# Patient Record
Sex: Male | Born: 1995 | Race: Black or African American | Hispanic: No | Marital: Single
Health system: Southern US, Community
[De-identification: ages and names within clinical notes are randomized; demographics above are authoritative.]

## PROBLEM LIST (undated history)

## (undated) DIAGNOSIS — I10 Essential (primary) hypertension: Secondary | ICD-10-CM

---
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
## 2013-01-05 ENCOUNTER — Encounter (HOSPITAL_COMMUNITY): Payer: Self-pay | Admitting: Emergency Medicine

## 2013-01-05 ENCOUNTER — Emergency Department (HOSPITAL_COMMUNITY)
Admission: EM | Admit: 2013-01-05 | Discharge: 2013-01-05 | Discharge status: Against Medical Advice | Payer: Medicaid Other | Source: Home / Self Care

## 2013-01-05 NOTE — ED Notes (Signed)
Victor Cranker, PA came into room and did not find pt.  Pt had notified me earlier that he was stepping outside to see a fam member and would be back soon Will wait 10 minutes till pt is d/c w/o being seen .

## 2013-01-05 NOTE — ED Notes (Signed)
Pt c/o pain/stiff neck on right side since Wed am Reports waking up to the pain and believes it may have been b/c he slept in a bad position Pain is constant and hurts to turn left/right Has not had any meds for discomfort Denies: inj/trauma to site, f/v/n/d  He is alert and oriented w/no signs of acute distress.

## 2014-06-01 ENCOUNTER — Emergency Department (HOSPITAL_COMMUNITY): Payer: No Typology Code available for payment source

## 2014-06-01 ENCOUNTER — Encounter (HOSPITAL_COMMUNITY): Payer: Self-pay | Admitting: Emergency Medicine

## 2014-06-01 ENCOUNTER — Emergency Department (HOSPITAL_COMMUNITY)
Admission: EM | Admit: 2014-06-01 | Discharge: 2014-06-01 | Disposition: A | Discharge status: Home / Self Care | Payer: No Typology Code available for payment source | Attending: Emergency Medicine | Admitting: Emergency Medicine

## 2014-06-01 DIAGNOSIS — T148XXA Other injury of unspecified body region, initial encounter: Secondary | ICD-10-CM

## 2014-06-01 DIAGNOSIS — Y92410 Unspecified street and highway as the place of occurrence of the external cause: Secondary | ICD-10-CM | POA: Insufficient documentation

## 2014-06-01 DIAGNOSIS — Y9389 Activity, other specified: Secondary | ICD-10-CM | POA: Insufficient documentation

## 2014-06-01 DIAGNOSIS — IMO0002 Reserved for concepts with insufficient information to code with codable children: Secondary | ICD-10-CM | POA: Diagnosis not present

## 2014-06-01 MED ORDER — NAPROXEN 500 MG PO TABS: 500.0000 mg | ORAL_TABLET | Freq: Two times a day (BID) | ORAL | Status: AC
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
## 2014-06-01 NOTE — ED Provider Notes (Signed)
Medical screening examination/treatment/procedure(s) were performed by non-physician practitioner and as supervising physician I was immediately available for consultation/collaboration.   EKG Interpretation None        Jadis Mika N Oswell Say, DO 06/01/14 2135 

## 2014-06-01 NOTE — ED Notes (Signed)
PA at bedside.

## 2014-06-01 NOTE — Discharge Instructions (Signed)

## 2014-06-02 NOTE — ED Provider Notes (Signed)
Medical screening examination/treatment/procedure(s) were performed by non-physician practitioner and as supervising physician I was immediately available for consultation/collaboration.   EKG Interpretation None        Kerrigan Gombos N Neila Teem, DO 06/02/14 0043 

## 2014-06-05 ENCOUNTER — Encounter (HOSPITAL_COMMUNITY): Payer: Self-pay | Admitting: Emergency Medicine

## 2014-06-05 ENCOUNTER — Emergency Department (INDEPENDENT_AMBULATORY_CARE_PROVIDER_SITE_OTHER)
Admission: EM | Admit: 2014-06-05 | Discharge: 2014-06-05 | Disposition: A | Discharge status: Home / Self Care | Payer: Medicaid Other | Source: Home / Self Care | Attending: Family Medicine | Admitting: Family Medicine

## 2014-06-05 DIAGNOSIS — M545 Low back pain, unspecified: Secondary | ICD-10-CM

## 2014-06-05 MED ORDER — CYCLOBENZAPRINE HCL 10 MG PO TABS: 10.0000 mg | ORAL_TABLET | Freq: Every evening | ORAL | Status: DC | PRN

## 2014-06-05 NOTE — Discharge Instructions (Signed)
Thank you for coming in today. Physical therapy should contact you soon.  Use a heating pad Continue Aleve Use Flexeril at bedtime for pain as needed  Come back or go to the emergency room if you notice new weakness new numbness problems walking or bowel or bladder problems.   Back Exercises These exercises may help you when beginning to rehabilitate your injury. Your symptoms may resolve with or without further involvement from your physician, physical therapist or athletic trainer. While completing these exercises, remember:   Restoring tissue flexibility helps normal motion to return to the joints. This allows healthier, less painful movement and activity.  An effective stretch should be held for at least 30 seconds.  A stretch should never be painful. You should only feel a gentle lengthening or release in the stretched tissue. STRETCH - Extension, Prone on Elbows   Lie on your stomach on the floor, a bed will be too soft. Place your palms about shoulder width apart and at the height of your head.  Place your elbows under your shoulders. If this is too painful, stack pillows under your chest.  Allow your body to relax so that your hips drop lower and make contact more completely with the floor.  Hold this position for __________ seconds.  Slowly return to lying flat on the floor. Repeat __________ times. Complete this exercise __________ times per day.  RANGE OF MOTION - Extension, Prone Press Ups   Lie on your stomach on the floor, a bed will be too soft. Place your palms about shoulder width apart and at the height of your head.  Keeping your back as relaxed as possible, slowly straighten your elbows while keeping your hips on the floor. You may adjust the placement of your hands to maximize your comfort. As you gain motion, your hands will come more underneath your shoulders.  Hold this position __________ seconds.  Slowly return to lying flat on the floor. Repeat  __________ times. Complete this exercise __________ times per day.  RANGE OF MOTION- Quadruped, Neutral Spine   Assume a hands and knees position on a firm surface. Keep your hands under your shoulders and your knees under your hips. You may place padding under your knees for comfort.  Drop your head and point your tail bone toward the ground below you. This will round out your low back like an angry cat. Hold this position for __________ seconds.  Slowly lift your head and release your tail bone so that your back sags into a large arch, like an old horse.  Hold this position for __________ seconds.  Repeat this until you feel limber in your low back.  Now, find your "sweet spot." This will be the most comfortable position somewhere between the two previous positions. This is your neutral spine. Once you have found this position, tense your stomach muscles to support your low back.  Hold this position for __________ seconds. Repeat __________ times. Complete this exercise __________ times per day.  STRETCH - Flexion, Single Knee to Chest   Lie on a firm bed or floor with both legs extended in front of you.  Keeping one leg in contact with the floor, bring your opposite knee to your chest. Hold your leg in place by either grabbing behind your thigh or at your knee.  Pull until you feel a gentle stretch in your low back. Hold __________ seconds.  Slowly release your grasp and repeat the exercise with the opposite side. Repeat __________ times.  Complete this exercise __________ times per day.  STRETCH - Hamstrings, Standing  Stand or sit and extend your right / left leg, placing your foot on a chair or foot stool  Keeping a slight arch in your low back and your hips straight forward.  Lead with your chest and lean forward at the waist until you feel a gentle stretch in the back of your right / left knee or thigh. (When done correctly, this exercise requires leaning only a small  distance.)  Hold this position for __________ seconds. Repeat __________ times. Complete this stretch __________ times per day. STRENGTHENING - Deep Abdominals, Pelvic Tilt   Lie on a firm bed or floor. Keeping your legs in front of you, bend your knees so they are both pointed toward the ceiling and your feet are flat on the floor.  Tense your lower abdominal muscles to press your low back into the floor. This motion will rotate your pelvis so that your tail bone is scooping upwards rather than pointing at your feet or into the floor.  With a gentle tension and even breathing, hold this position for __________ seconds. Repeat __________ times. Complete this exercise __________ times per day.  STRENGTHENING - Abdominals, Crunches   Lie on a firm bed or floor. Keeping your legs in front of you, bend your knees so they are both pointed toward the ceiling and your feet are flat on the floor. Cross your arms over your chest.  Slightly tip your chin down without bending your neck.  Tense your abdominals and slowly lift your trunk high enough to just clear your shoulder blades. Lifting higher can put excessive stress on the low back and does not further strengthen your abdominal muscles.  Control your return to the starting position. Repeat __________ times. Complete this exercise __________ times per day.  STRENGTHENING - Quadruped, Opposite UE/LE Lift   Assume a hands and knees position on a firm surface. Keep your hands under your shoulders and your knees under your hips. You may place padding under your knees for comfort.  Find your neutral spine and gently tense your abdominal muscles so that you can maintain this position. Your shoulders and hips should form a rectangle that is parallel with the floor and is not twisted.  Keeping your trunk steady, lift your right hand no higher than your shoulder and then your left leg no higher than your hip. Make sure you are not holding your breath.  Hold this position __________ seconds.  Continuing to keep your abdominal muscles tense and your back steady, slowly return to your starting position. Repeat with the opposite arm and leg. Repeat __________ times. Complete this exercise __________ times per day. Document Released: 09/16/2005 Document Revised: 11/21/2011 Document Reviewed: 12/11/2008 [facility] Patient Information 2015 [place], [place]. This information is not intended to replace advice given to you by your health care provider. Make sure you discuss any questions you have with your health care provider.
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
## 2014-06-05 NOTE — ED Notes (Signed)
Pt triaged and assessed by provider.   Provider in room before nurse.  

## 2014-07-08 ENCOUNTER — Ambulatory Visit: Payer: Medicaid Other | Attending: Family Medicine

## 2014-07-08 DIAGNOSIS — M545 Low back pain: Secondary | ICD-10-CM | POA: Insufficient documentation

## 2014-07-08 DIAGNOSIS — I10 Essential (primary) hypertension: Secondary | ICD-10-CM | POA: Diagnosis not present

## 2014-07-08 DIAGNOSIS — Z5189 Encounter for other specified aftercare: Secondary | ICD-10-CM | POA: Diagnosis present

## 2014-07-09 ENCOUNTER — Ambulatory Visit: Payer: Medicaid Other

## 2014-07-09 DIAGNOSIS — Z5189 Encounter for other specified aftercare: Secondary | ICD-10-CM | POA: Diagnosis not present

## 2014-07-18 ENCOUNTER — Ambulatory Visit: Payer: Medicaid Other | Attending: Family Medicine | Admitting: Physical Therapy

## 2014-07-18 DIAGNOSIS — I10 Essential (primary) hypertension: Secondary | ICD-10-CM | POA: Insufficient documentation

## 2014-07-18 DIAGNOSIS — Z5189 Encounter for other specified aftercare: Secondary | ICD-10-CM | POA: Insufficient documentation

## 2014-07-18 DIAGNOSIS — M545 Low back pain: Secondary | ICD-10-CM | POA: Insufficient documentation

## 2014-07-21 ENCOUNTER — Ambulatory Visit: Payer: Medicaid Other

## 2014-07-21 DIAGNOSIS — M545 Low back pain, unspecified: Secondary | ICD-10-CM

## 2014-07-21 DIAGNOSIS — I10 Essential (primary) hypertension: Secondary | ICD-10-CM | POA: Diagnosis not present

## 2014-07-21 DIAGNOSIS — Z5189 Encounter for other specified aftercare: Secondary | ICD-10-CM | POA: Diagnosis present

## 2014-07-21 NOTE — Therapy (Signed)
Physical Therapy Treatment  Patient Details  Name: Victor Reyes MRN: [id number] Date of Birth: 1996-03-06  Encounter Date: 07/21/2014      PT End of Session - 07/21/14 1351    Visit Number 3   Number of Visits 10   Date for PT Re-Evaluation 08/12/14   PT Start Time 1330   PT Stop Time 1415   PT Time Calculation (min) 45 min   Activity Tolerance Patient tolerated treatment well      No past medical history on file.  No past surgical history on file.  There were no vitals taken for this visit.  Visit Diagnosis:  Right-sided low back pain without sciatica          [facility] Adult PT Treatment/Exercise - 07/21/14 1342    Exercises   Exercises Lumbar   Lumbar Exercises: Stretches   Active Hamstring Stretch 3 reps;30 seconds;Other (comment)   Active Hamstring Stretch Limitations bil, seated    Standing Extension 2 reps;Other (comment)   Standing Extension Limitations 10 reps    Press Ups 3 reps;Other (comment)   Press Ups Limitations 10 reps   Lumbar Exercises: Prone   Straight Leg Raise 10 reps;Other (comment)   Straight Leg Raises Limitations prone hip ext, 2 sets each    Opposite Arm/Leg Raise Right arm/Left leg;Left arm/Right leg;10 reps;2 seconds   Modalities   Modalities Moist Heat   Moist Heat Therapy   Number Minutes Moist Heat 13 Minutes   Moist Heat Location Other (comment)  lumbar spine, pt prone.    Manual Therapy   Manual Therapy Myofascial release   Manual Therapy   Myofascial Release STM and MFR to L/S with increased tenderness at R paraspinals.           Education - 07/21/14 1351    Education provided Yes   Education Details HEP   Education Details Patient   Methods Explanation;Demonstration   Comprehension Verbalized understanding;Returned demonstration;Verbal cues required;Need further instruction          PT Short Term Goals - 07/21/14 1409    PT SHORT TERM GOAL #1   Title All STGs= LTGs   Time 2   Period Weeks   Status On-going    PT SHORT TERM GOAL #2   Title be indep with initial HEP    Time 2   Period Weeks   Status On-going   PT SHORT TERM GOAL #3   Title report decrease by 2 grades    Time 2   Period Weeks   Status On-going   PT SHORT TERM GOAL #4   Title demonstrated and verbalize proper posture and positions to avoid   Time 2   Period Weeks   Status On-going          PT Long Term Goals - 07/21/14 1410    PT LONG TERM GOAL #1   Title Demonstrate and verbalize techniques to reduce the risk of re-injury to include info on: body mechanics, posture.   Time 5   Period Weeks   Status On-going   PT LONG TERM GOAL #2   Title be indep with advanced HEP   Time 5   Period Weeks   Status On-going   PT LONG TERM GOAL #3   Title increase ROM lumbar flexion to 50 degrees without pain to improve bending and stooping   Time 5   Period Weeks   Status On-going   PT LONG TERM GOAL #4   Title demonstrate proper body mechanics  for heavy lifting pain-free > 20#   Time 5   Period Weeks   Status On-going   PT LONG TERM GOAL #5   Title be pain-free with all ADLs and work activites, like reaching overhead   Time 5   Period Weeks   Status Not Met   Additional Long Term Goals   Additional Long Term Goals Yes   PT LONG TERM GOAL #6   Title Bil Hamstring length improve to -28 degrees from straight in 90/90 position supine to improve flexibility   Time 5   Period Weeks   Status On-going          Plan - 07/21/14 1407    Clinical Impression Statement Good progress initially, but pt reprots increased pain at work. 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 Pt also admits to HEP. Reviewed HEP and gave written instructions again.    Pt will benefit from skilled therapeutic intervention in order to improve on the following deficits Pain;Decreased strength   Rehab Potential Excellent   PT Frequency 2x / week   PT Duration Other (comment)  5 weeks   PT Treatment/Interventions Moist Heat;Therapeutic  exercise;Manual techniques;Patient/family education   PT Next Visit Plan Progress lumbar extensor strengthening, Establish core strengthening.    Consulted and Agree with Plan of Care Patient        Problem List There are no active problems to display for this patient.                                           Dollene Cleveland 07/21/2014, 2:17 PM

## 2014-07-21 NOTE — Patient Instructions (Addendum)
Reviewed importance of HEP daily as patient admits to noncompliance.    Extension   Lie face down, hands close to chest. Press trunk up as you exhale, relaxing back. Keep neck long, shoulders down. Relax buttocks to protect lower back. Hold _3___ seconds. Repeat 10 times each,  Hourly (do  __10__ sessions per day).  Copyright  VHI. All rights reserved.   If you cannot get onto ground to press- up:   Backward Bend (Standing)   Arch backward to make hollow of back deeper as you exhale. Hold _2___ seconds. Repeat _10___ times per set. Do __1__ sets per session. Do hourly or  __10__ sessions per day.  http://orth.exer.us/178   Copyright  VHI. All rights reserved.     Hamstring Stretch   Sit on edge of chair (not a ball as pictured). Inhale and straighten spine. Exhale and lean forward toward extended leg. Hold position for 30 seconds. Inhale and come back to center. Repeat with other leg extended. Repeat __3_ times, alternating legs. Do _3__ times per day.  Copyright  VHI. All rights reserved.      (Home) Extension: Hip   With support under abdomen, tighten stomach. Lift right leg in line with body. Do not hyperextend. Alternate legs. Repeat _10___ times per set. Do __2__ sets per session. Do 3 times a day. Copyright  VHI. All rights reserved.

## 2014-07-28 ENCOUNTER — Ambulatory Visit: Payer: Medicaid Other

## 2014-07-28 ENCOUNTER — Telehealth: Payer: Self-pay

## 2014-07-28 NOTE — Telephone Encounter (Signed)
PT called pt to follow-up after 2 consecutive no-shows. Pt reports he was called into work and was unable to make last 2 appts, as a result. PT instructed pt to call [phone], if he would like to schedule further appts.

## 2014-08-05 ENCOUNTER — Ambulatory Visit: Payer: Medicaid Other

## 2014-08-05 DIAGNOSIS — M545 Low back pain, unspecified: Secondary | ICD-10-CM

## 2014-08-05 DIAGNOSIS — Z5189 Encounter for other specified aftercare: Secondary | ICD-10-CM | POA: Diagnosis not present

## 2014-08-05 NOTE — Patient Instructions (Signed)
Victor Reyes was asked to continue his HEP as he started doing them after the last visit and he pain resolved,

## 2014-08-05 NOTE — Therapy (Signed)
Physical Therapy Treatment  Patient Details  Name: Victor Reyes MRN: [id number] Date of Birth: 1995/11/06  Encounter Date: 08/05/2014      PT End of Session - 08/05/14 1249    Visit Number 4   Date for PT Re-Evaluation 08/12/14   Activity Tolerance Patient tolerated treatment well   Behavior During Therapy [facility] for tasks assessed/performed      No past medical history on file.  No past surgical history on file.  There were no vitals taken for this visit.  Visit Diagnosis:  Right-sided low back pain without sciatica      Subjective Assessment - 08/05/14 1234    Symptoms Fine now. Everything going well. No pain for a week.                 PT Short Term Goals - 08/05/14 1250    PT SHORT TERM GOAL #1   Title All STGs= LTGs   Status Achieved   PT SHORT TERM GOAL #2   Title be indep with initial HEP    Status Achieved   PT SHORT TERM GOAL #3   Title report decrease by 2 grades    Status Achieved   PT SHORT TERM GOAL #4   Title demonstrated and verbalize proper posture and positions to avoid   Status Achieved          PT Long Term Goals - 08/05/14 1250    PT LONG TERM GOAL #1   Title Demonstrate and verbalize techniques to reduce the risk of re-injury to include info on: body mechanics, posture.   Baseline We discussed this at discharge   Status Achieved   PT LONG TERM GOAL #2   Title be indep with advanced HEP   Status Achieved   PT LONG TERM GOAL #3   Title Increase lumbar glexion to 50 degrees without pain   Baseline He is limited but with no pain   Status Partially Met   PT LONG TERM GOAL #4   Title demonstrate proper body mechanics for heavy lifting pain-free > 20#   Status Deferred   PT LONG TERM GOAL #5   Title be pain-free with all ADLs and work activites, like reaching overhead   Status Achieved   PT LONG TERM GOAL #6   Title Bil Hamstring length improve to -28 degrees from straight in 90/90 position supine to improve flexibility   Status Deferred          Plan - 08/05/14 1249    Clinical Impression Statement Victor Reyes has no pain and agreed to discharge   PT Next Visit Plan None . Discharge with HEP   Consulted and Agree with Plan of Care Patient        Problem List There are no active problems to display for this patient.    PHYSICAL THERAPY DISCHARGE SUMMARY  Visits from Start of Care: 4  Current functional level related to goals / functional outcomes: He has returned to full activity without pain   Remaining deficits: He continues with stiffness and poor posture but is back to full activity   Education / Equipment: Posture and flexibility  Plan: Patient agrees to discharge.  Patient goals were partially met. Patient is being discharged due to being pleased with the current functional level.  ?????  Darrel Hoover PT 08/05/2014, 12:53 PM

## 2014-11-01 ENCOUNTER — Emergency Department (HOSPITAL_COMMUNITY)
Admission: EM | Admit: 2014-11-01 | Discharge: 2014-11-01 | Disposition: A | Discharge status: Home / Self Care | Payer: Medicaid Other | Attending: Emergency Medicine | Admitting: Emergency Medicine

## 2014-11-01 ENCOUNTER — Encounter (HOSPITAL_COMMUNITY): Payer: Self-pay | Admitting: Family Medicine

## 2014-11-01 DIAGNOSIS — K625 Hemorrhage of anus and rectum: Secondary | ICD-10-CM | POA: Diagnosis present

## 2014-11-01 DIAGNOSIS — Z791 Long term (current) use of non-steroidal anti-inflammatories (NSAID): Secondary | ICD-10-CM | POA: Insufficient documentation

## 2014-11-01 LAB — POC OCCULT BLOOD, ED: Fecal Occult Bld: NEGATIVE

## 2014-11-01 NOTE — ED Provider Notes (Signed)
CSN: [id number]     Arrival date & time 11/01/14  1344 History   First MD Initiated Contact with Patient 11/01/14 1419     Chief Complaint  Patient presents with  . Rectal Bleeding     (Consider location/radiation/quality/duration/timing/severity/associated sxs/prior Treatment) HPI 19 year old male who comes today stating that he strained to have a bowel movement this morning and then noted that there was blood in the water of the commode. The stool appeared normal to him. He did not have a bowel movement yesterday because he was working was unable to.  Today he feels it was harder than normal. He has not noted any previous hemorrhoids, rectal pain, or rectal bleeding. He has no other symptoms today. He has not lightheaded, denies abdominal pain, has no other medical history.  History reviewed. No pertinent past medical history. History reviewed. No pertinent past surgical history. History reviewed. No pertinent family history. History  Substance Use Topics  . Smoking status: Never Smoker   . Smokeless tobacco: Not on file  . Alcohol Use: No    Review of Systems  All other systems reviewed and are negative.     Allergies  Review of patient's allergies indicates no known allergies.  Home Medications   Prior to Admission medications   Medication Sig Start Date End Date Taking? Authorizing Provider  cyclobenzaprine (FLEXERIL) 10 MG tablet Take 1 tablet (10 mg total) by mouth at bedtime as needed for muscle spasms. 06/05/14   Rodolph BongEvan S Corey, MD  naproxen (NAPROSYN) 500 MG tablet Take 1 tablet (500 mg total) by mouth 2 (two) times daily. 06/01/14   Hope Orlene OchM Neese, NP   BP 146/79 mmHg  Pulse 82  Temp(Src) 98.3 F (36.8 C)  Resp 18  SpO2 99% Physical Exam  Constitutional: He is oriented to person, place, and time. He appears well-developed and well-nourished.  HENT:  Head: Normocephalic and atraumatic.  Right Ear: External ear normal.  Left Ear: External ear normal.  Nose: Nose  normal.  Mouth/Throat: Oropharynx is clear and moist.  Eyes: Conjunctivae and EOM are normal. Pupils are equal, round, and reactive to light.  Neck: Normal range of motion. Neck supple.  Cardiovascular: Normal rate, regular rhythm, normal heart sounds and intact distal pulses.   Pulmonary/Chest: Effort normal and breath sounds normal. No respiratory distress. He has no wheezes. He exhibits no tenderness.  Abdominal: Soft. Bowel sounds are normal. He exhibits no distension and no mass. There is no tenderness. There is no guarding.  Genitourinary: Rectum normal.  Stool is light brown and no signs of blood are noted on exam Hemorrhoids or rectal fissure noted.  Musculoskeletal: Normal range of motion.  Neurological: He is alert and oriented to person, place, and time. He has normal reflexes. He exhibits normal muscle tone. Coordination normal.  Skin: Skin is warm and dry.  Psychiatric: He has a normal mood and affect. His behavior is normal. Judgment and thought content normal.  Nursing note and vitals reviewed.   ED Course  Procedures (including critical care time) Labs Review Labs Reviewed  POC OCCULT BLOOD, ED    Imaging Review No results found.   EKG Interpretation None      MDM   Final diagnoses:  Rectal bleeding    19 year old male complaining of rectal bleeding. Otherwise no problems and normal exam. Rectal exam is normal and stool is brown and Hemoccult negative. I've advised him to return if he has any worsening symptoms. I also advised him regarding stool softeners  and regular bowel habits.   Hilario Quarry, MD 11/01/14 [phone]

## 2014-11-01 NOTE — ED Notes (Signed)
NAD at this time.  

## 2014-11-01 NOTE — ED Notes (Signed)
Per pt sts he was straining for BM this am and there was blood in the toilet and when he wiped. sts also once this afternoon. Denies pain.

## 2014-11-01 NOTE — Discharge Instructions (Signed)
Rectal Bleeding  Rectal bleeding is when blood comes out of the opening of the butt (anus). Rectal bleeding may show up as bright red blood or really dark poop (stool). The poop may look dark red, maroon, or black. Rectal bleeding is often a sign that something is wrong. This needs to be checked by a doctor.  HOME CARE  Eat a diet high in fiber. This will help keep your poop soft.  Limit activity.  Drink enough fluids to keep your pee (urine) clear or pale yellow.  Take a warm bath to soothe any pain.  Follow up with your doctor as told. GET HELP RIGHT AWAY IF:  You have more bleeding.  You have black or dark red poop.  You throw up (vomit) blood or it looks like coffee grounds.  You have belly (abdominal) pain or tenderness.  You have a fever.  You feel weak, sick to your stomach (nauseous), or you pass out (faint).  You have pain that is so bad you cannot poop (bowel movement). MAKE SURE YOU:  Understand these instructions.  Will watch your condition.  Will get help right away if you are not doing well or get worse. Document Released: 05/11/2011 Document Revised: 01/13/2014 Document Reviewed: 05/11/2011 ExitCare Patient Information 2015 [employer]. This information is not intended to replace advice given to you by your health care provider. Make sure you discuss any questions you have with your health care provider.  

## 2015-09-03 ENCOUNTER — Encounter (HOSPITAL_COMMUNITY): Payer: Self-pay | Admitting: *Deleted

## 2015-09-03 ENCOUNTER — Emergency Department (HOSPITAL_COMMUNITY): Payer: Medicaid Other

## 2015-09-03 ENCOUNTER — Emergency Department (HOSPITAL_COMMUNITY)
Admission: EM | Admit: 2015-09-03 | Discharge: 2015-09-03 | Disposition: A | Discharge status: Home / Self Care | Payer: Medicaid Other | Attending: Emergency Medicine | Admitting: Emergency Medicine

## 2015-09-03 DIAGNOSIS — R61 Generalized hyperhidrosis: Secondary | ICD-10-CM | POA: Insufficient documentation

## 2015-09-03 DIAGNOSIS — R05 Cough: Secondary | ICD-10-CM | POA: Insufficient documentation

## 2015-09-03 DIAGNOSIS — I10 Essential (primary) hypertension: Secondary | ICD-10-CM | POA: Insufficient documentation

## 2015-09-03 DIAGNOSIS — R059 Cough, unspecified: Secondary | ICD-10-CM

## 2015-09-03 HISTORY — DX: Essential (primary) hypertension: I10

## 2015-09-03 MED ORDER — BENZONATATE 100 MG PO CAPS: 100.0000 mg | ORAL_CAPSULE | Freq: Three times a day (TID) | ORAL | Status: DC

## 2015-09-03 NOTE — Discharge Instructions (Signed)
Your exam and chest x-ray were normal. The blood you saw was likely due to irritation from coughing and from yelling/screaming last night. Your cold last week and current lingering cough are likely due to a virus, and it will just take time for your body to recover. I will give you a prescription for a cough suppressant. You may use warm salt water to gargle as needed to help with hoarse voice. You may also consider a humidifer in your room at night to prevent your throat from getting too dry. Otherwise you should recover within a few days. Return to the ER for any new or worsening symptoms.  Take medications as prescribed. Return to the emergency room for worsening condition or new concerning symptoms. Follow up with your regular doctor. If you don't have a regular doctor use one of the numbers below to establish a primary care doctor.   Emergency Department Resource Guide 1) Find a Doctor and Pay Out of Pocket Although you won't have to find out who is covered by your insurance plan, it is a good idea to ask around and get recommendations. You will then need to call the office and see if the doctor you have chosen will accept you as a new patient and what types of options they offer for patients who are self-pay. Some doctors offer discounts or will set up payment plans for their patients who do not have insurance, but you will need to ask so you aren't surprised when you get to your appointment.  2) Contact Your Local Health Department Not all health departments have doctors that can see patients for sick visits, but many do, so it is worth a call to see if yours does. If you don't know where your local health department is, you can check in your phone book. The CDC also has a tool to help you locate your state's health department, and many state websites also have listings of all of their local health departments.  3) Find a Walk-in Clinic If your illness is not likely to be very severe or  complicated, you may want to try a walk in clinic. These are popping up all over the country in pharmacies, drugstores, and shopping centers. They're usually staffed by nurse practitioners or physician assistants that have been trained to treat common illnesses and complaints. They're usually fairly quick and inexpensive. However, if you have serious medical issues or chronic medical problems, these are probably not your best option.  No Primary Care Doctor: - Call Health Connect at  [phone] - they can help you locate a primary care doctor that  accepts your insurance, provides certain services, etc. - Physician Referral [phone]  Emergency Department Resource Guide 1) Find a Doctor and Pay Out of Pocket Although you won't have to find out who is covered by your insurance plan, it is a good idea to ask around and get recommendations. You will then need to call the office and see if the doctor you have chosen will accept you as a new patient and what types of options they offer for patients who are self-pay. Some doctors offer discounts or will set up payment plans for their patients who do not have insurance, but you will need to ask so you aren't surprised when you get to your appointment.  2) Contact Your Local Health Department Not all health departments have doctors that can see patients for sick visits, but many do, so it is worth a call to see  if yours does. If you don't know where your local health department is, you can check in your phone book. The CDC also has a tool to help you locate your state's health department, and many state websites also have listings of all of their local health departments.  3) Find a Walk-in Clinic If your illness is not likely to be very severe or complicated, you may want to try a walk in clinic. These are popping up all over the country in pharmacies, drugstores, and shopping centers. They're usually staffed by nurse practitioners or physician  assistants that have been trained to treat common illnesses and complaints. They're usually fairly quick and inexpensive. However, if you have serious medical issues or chronic medical problems, these are probably not your best option.  No Primary Care Doctor: - Call Health Connect at  [phone] - they can help you locate a primary care doctor that  accepts your insurance, provides certain services, etc. - Physician Referral Service- [phone]  Chronic Pain Problems: Organization         Address  Phone   Notes  [facility]  [phone] Patients need to be referred by their primary care doctor.   Medication Assistance: Organization         Address  Phone   Notes  [facility] [address] [place], [place] [address] [phone] --Must be a resident of [place] [facility] -- Must have NO insurance coverage whatsoever (no Medicaid/ Medicare, etc.) -- The pt. MUST have a primary care doctor that directs their care regularly and follows them in the community   MedAssist  [phone]   [employer]  [phone]    Agencies that provide inexpensive medical care: Organization         Address  Phone   Notes  [facility]  [phone]   [facility]    [phone]   [facility] [address] [place], [place] [address] [phone]   [facility] [address], [place] [phone]   [facility]    [phone]   [facility]    [phone]   [facility]  [address], [place] Phone:  [phone], Fax:  [phone] Hours of Operation:  9 am - 6 pm, M-F.  Also accepts Medicaid/Medicare and self-pay.  [facility]  [address], [place] Phone: [phone]) [phone], Fax: [phone]. Hours of Operation:  8:30 am - 5:30 pm, M-F.  Also accepts  Medicaid and self-pay.  [facility] [place] [address], [place] Phone: [phone]) [phone]   [facility] [address], [place], [place] [phone], Ext. [phone] Mondays & Thursdays: 7-9 AM.  First 15 patients are seen on a first come, first serve basis.    Medicaid-accepting [facility] Providers:  Organization         Address  Phone   Notes  [facility] [address], [place] [phone] Also accepts self-pay patients.  [facility] [address], [place]  [phone]   [facility] [address], [place] [phone]) [phone]   [facility] [address] [place] [address], [place] [phone]   Renaye RakersVeita Bland [address], [place]   [phone] Only accepts [place] Access Medicaid patients after they have their name applied to their card.   Self-Pay (no insurance) in [facility]:  Organization         Address  Phone   Notes  Sickle Cell Patients, [facility] [address] [place], [place] [phone]   [facility] [address] [place], [place] [phone]   [facility]  [address], [place] [phone]   [facility]/Dr. Osei-Bonsu  [address], [place] or [address], [place] [phone] Phone number for both [place] and [place] locations is the same.  [facility] [address] [place] [phone]   [facility] [address] [place] or [address] [phone]   [facility] [address], [place] [phone], phone; [phone], fax Sees patients 1st and 3rd Saturday of every month.  Must not qualify for public or private insurance (i.e. Medicaid, Medicare, [place] Health Choice, Veterans' Benefits)  Household  income should be no more than 200% of the poverty level The clinic cannot treat you if you are pregnant or think you are pregnant  Sexually transmitted diseases are not treated at the clinic.

## 2015-09-03 NOTE — ED Provider Notes (Signed)
CSN: [id number]     Arrival date & time 09/03/15  1517 History   By signing my name below, I, Victor Reyes, attest that this documentation has been prepared under the direction and in the presence of Victor Reyes, [place]. Electronically Signed: Angelene Reyes, ED Scribe. 09/03/2015. 3:52 PM.    Chief Complaint  Patient presents with  . Cough   The history is provided by the patient. No language interpreter was used.   HPI Comments: Victor Reyes is a 19 y.o. male with a hx of HTN who presents to the Emergency Department complaining of gradaully improving intermittent non-productive cough onset onset one week ago. He reports that he is here because he had a coughing spell in the middle of night with blood-tinged sputum and diaphoresis. He denies further episodes of blood since then. He reports associated mild hoarse voice but attributes that to screaming a lot last night. He denies any fever, sore throat, or n/v. He states that he has been drinking sparkling water with mild relief. He reports that he had cold symptoms last week.  Since then he has had occasional non-productive cough. Denies chest pain, SOB. He reports that he smokes occassionally.    Past Medical History  Diagnosis Date  . Hypertension    History reviewed. No pertinent past surgical history. No family history on file. Social History  Substance Use Topics  . Smoking status: Never Smoker   . Smokeless tobacco: None  . Alcohol Use: No    Review of Systems  Constitutional: Positive for diaphoresis. Negative for fever and chills.  HENT: Negative for sore throat.   Respiratory: Positive for cough.   Gastrointestinal: Negative for nausea, vomiting and diarrhea.  All other systems reviewed and are negative.     Allergies  Review of patient's allergies indicates no known allergies.  Home Medications   Prior to Admission medications   Medication Sig Start Date End Date Taking? Authorizing Provider  cyclobenzaprine  (FLEXERIL) 10 MG tablet Take 1 tablet (10 mg total) by mouth at bedtime as needed for muscle spasms. Patient not taking: Reported on 11/01/2014 06/05/14   Rodolph Bong, MD  naproxen (NAPROSYN) 500 MG tablet Take 1 tablet (500 mg total) by mouth 2 (two) times daily. Patient not taking: Reported on 11/01/2014 06/01/14   Janne Napoleon, NP   BP 159/79 mmHg  Pulse 97  Temp(Src) 99.7 F (37.6 C) (Oral)  Resp 18  SpO2 99% Physical Exam  Constitutional: He is oriented to person, place, and time. He appears well-developed and well-nourished. No distress.  HENT:  Head: Normocephalic and atraumatic.  Mouth/Throat: Mucous membranes are normal. Posterior oropharyngeal erythema present. No oropharyngeal exudate or posterior oropharyngeal edema.  Eyes: Conjunctivae and EOM are normal.  Neck: Neck supple. No tracheal deviation present.  Cardiovascular: Normal rate.   Pulmonary/Chest: Effort normal. No respiratory distress.  Musculoskeletal: Normal range of motion.  Neurological: He is alert and oriented to person, place, and time.  Skin: Skin is warm and dry.  Psychiatric: He has a normal mood and affect. His behavior is normal.  Nursing note and vitals reviewed.   ED Course  Procedures (including critical care time) DIAGNOSTIC STUDIES: Oxygen Saturation is 99% on RA, normal by my interpretation.    COORDINATION OF CARE: 3:49 PM- Pt advised of plan for treatment and pt agrees. Pt will receive chest x-ray and explained that if results are negative, will not prescribe any antibiotics.    Imaging Review Dg Chest 2 View  09/03/2015  CLINICAL DATA:  Sore throat and hemoptysis. EXAM: CHEST  2 VIEW COMPARISON:  None. FINDINGS: Cardiomediastinal silhouette is normal. Mediastinal contours appear intact. There is no evidence of focal airspace consolidation, pleural effusion or pneumothorax. Osseous structures are without acute abnormality. Soft tissues are grossly normal. IMPRESSION: No active cardiopulmonary  disease. Electronically Signed   By: Victor Mcalpineobrinka  Reyes M.D.   On: 09/03/2015 16:13     Victor PennerSerena Myrel Rappleye, PA-C has personally reviewed and evaluated these images as part of her medical decision-making.  MDM   Final diagnoses:  Cough    Suspect blood-tinged sputum is secondary to irritation from coughing and screaming last night. Will get CXR to r/o pneumonia or other cardiopulmonary acute pathology. I suspect pt is still recovering from viral URI. He is afebrile (temp slightly elevated to 99.7), not tachycardic, and maintaining good SpO2 on RA. Anticipate d/c home with supportive meds.  CXR negative. Will give rx for tessalon. PCP f/u encouraged. Pt's BP slightly elevated in the ED. Instructed pt to f/u with PCP regarding this as well. ER returnh precautions given.   I personally performed the services described in this documentation, which was scribed in my presence. The recorded information has been reviewed and is accurate.   Victor CoriaSerena Y Dorisann Schwanke, PA-C 09/03/15 1623  Victor MuldersScott Zackowski, MD 09/05/15 [phone]

## 2015-09-03 NOTE — ED Notes (Signed)
Called pt multiple times with no response.

## 2015-09-03 NOTE — ED Notes (Signed)
The pt reports that he has had a cough cold symptoms since yesterday.  Today he woke up and he was hot. He coughed up a speck of blood  And he decided to have it checked

## 2015-09-03 NOTE — ED Notes (Signed)
He reports that he has high no bp meds since he was in the 8th grade

## 2017-10-06 ENCOUNTER — Other Ambulatory Visit: Payer: Self-pay

## 2017-10-06 ENCOUNTER — Emergency Department (HOSPITAL_COMMUNITY)
Admission: EM | Admit: 2017-10-06 | Discharge: 2017-10-06 | Disposition: A | Discharge status: Home / Self Care | Payer: Self-pay | Attending: Emergency Medicine | Admitting: Emergency Medicine

## 2017-10-06 ENCOUNTER — Emergency Department (HOSPITAL_COMMUNITY): Payer: Self-pay

## 2017-10-06 ENCOUNTER — Encounter (HOSPITAL_COMMUNITY): Payer: Self-pay

## 2017-10-06 DIAGNOSIS — J36 Peritonsillar abscess: Secondary | ICD-10-CM | POA: Insufficient documentation

## 2017-10-06 DIAGNOSIS — I10 Essential (primary) hypertension: Secondary | ICD-10-CM | POA: Insufficient documentation

## 2017-10-06 LAB — CBC WITH DIFFERENTIAL/PLATELET
BASOS ABS: 0 10*3/uL (ref 0.0–0.1)
Basophils Relative: 0 %
Eosinophils Absolute: 0 10*3/uL (ref 0.0–0.7)
Eosinophils Relative: 0 %
HCT: 44.5 % (ref 39.0–52.0)
Hemoglobin: 14.7 g/dL (ref 13.0–17.0)
LYMPHS ABS: 3.2 10*3/uL (ref 0.7–4.0)
Lymphocytes Relative: 16 %
MCH: 26.7 pg (ref 26.0–34.0)
MCHC: 33 g/dL (ref 30.0–36.0)
MCV: 80.9 fL (ref 78.0–100.0)
MONO ABS: 1.6 10*3/uL — AB (ref 0.1–1.0)
Monocytes Relative: 8 %
NEUTROS ABS: 15.5 10*3/uL — AB (ref 1.7–7.7)
Neutrophils Relative %: 76 %
PLATELETS: 289 10*3/uL (ref 150–400)
RBC: 5.5 MIL/uL (ref 4.22–5.81)
RDW: 13.7 % (ref 11.5–15.5)
WBC: 20.3 10*3/uL — ABNORMAL HIGH (ref 4.0–10.5)

## 2017-10-06 LAB — BASIC METABOLIC PANEL
Anion gap: 15 (ref 5–15)
BUN: 13 mg/dL (ref 6–20)
CALCIUM: 9.5 mg/dL (ref 8.9–10.3)
CO2: 24 mmol/L (ref 22–32)
CREATININE: 1.53 mg/dL — AB (ref 0.61–1.24)
Chloride: 95 mmol/L — ABNORMAL LOW (ref 101–111)
GFR calc Af Amer: >60 mL/min (ref >60)
GFR calc non Af Amer: >60 mL/min (ref >60)
GLUCOSE: 144 mg/dL — AB (ref 65–99)
Potassium: 3.6 mmol/L (ref 3.5–5.1)
Sodium: 134 mmol/L — ABNORMAL LOW (ref 135–145)

## 2017-10-06 LAB — RAPID STREP SCREEN (MED CTR MEBANE ONLY): Streptococcus, Group A Screen (Direct): NEGATIVE

## 2017-10-06 MED ORDER — ACETAMINOPHEN 325 MG PO TABS
650.0000 mg | ORAL_TABLET | Freq: Once | ORAL | Status: AC
  Administered 2017-10-06: 650 mg via ORAL
  Filled 2017-10-06: qty 2

## 2017-10-06 MED ORDER — HYDROCODONE-ACETAMINOPHEN 7.5-325 MG/15ML PO SOLN: 15.0000 mL | Freq: Four times a day (QID) | ORAL | 0 refills | Status: DC | PRN

## 2017-10-06 MED ORDER — SODIUM CHLORIDE 0.9 % IV BOLUS (SEPSIS)
1000.0000 mL | Freq: Once | INTRAVENOUS | Status: AC
  Administered 2017-10-06: 1000 mL via INTRAVENOUS

## 2017-10-06 MED ORDER — IOPAMIDOL (ISOVUE-300) INJECTION 61%
INTRAVENOUS | Status: AC
  Administered 2017-10-06: 75 mL
  Filled 2017-10-06: qty 75

## 2017-10-06 MED ORDER — ONDANSETRON HCL 4 MG/2ML IJ SOLN
4.0000 mg | Freq: Once | INTRAMUSCULAR | Status: AC
  Administered 2017-10-06: 4 mg via INTRAVENOUS
  Filled 2017-10-06: qty 2

## 2017-10-06 MED ORDER — DEXAMETHASONE SODIUM PHOSPHATE 10 MG/ML IJ SOLN
10.0000 mg | Freq: Once | INTRAMUSCULAR | Status: AC
  Administered 2017-10-06: 10 mg via INTRAVENOUS
  Filled 2017-10-06: qty 1

## 2017-10-06 MED ORDER — LIDOCAINE-EPINEPHRINE (PF) 2 %-1:200000 IJ SOLN
20.0000 mL | Freq: Once | INTRAMUSCULAR | Status: AC
  Administered 2017-10-06: 20 mL via INTRADERMAL
  Filled 2017-10-06: qty 20

## 2017-10-06 MED ORDER — CLINDAMYCIN HCL 300 MG PO CAPS: 300.0000 mg | ORAL_CAPSULE | Freq: Four times a day (QID) | ORAL | 0 refills | Status: DC

## 2017-10-06 MED ORDER — IBUPROFEN 600 MG PO TABS: 600.0000 mg | ORAL_TABLET | Freq: Four times a day (QID) | ORAL | 0 refills | Status: AC | PRN

## 2017-10-06 MED ORDER — MORPHINE SULFATE (PF) 4 MG/ML IV SOLN
4.0000 mg | Freq: Once | INTRAVENOUS | Status: AC
  Administered 2017-10-06: 4 mg via INTRAVENOUS
  Filled 2017-10-06: qty 1

## 2017-10-06 MED ORDER — CLINDAMYCIN PHOSPHATE 900 MG/50ML IV SOLN
900.0000 mg | Freq: Once | INTRAVENOUS | Status: AC
  Administered 2017-10-06: 900 mg via INTRAVENOUS
  Filled 2017-10-06: qty 50

## 2017-10-06 NOTE — Discharge Instructions (Signed)
Please take medications as prescribed.  Follow up with ENT as needed if your condition worsen.

## 2017-10-06 NOTE — ED Notes (Signed)
Discharge instructions and prescriptions reviewed - voiced understanding 

## 2017-10-06 NOTE — ED Notes (Signed)
ENT at bedside

## 2017-10-06 NOTE — Consult Note (Signed)
Reason for Consult: Left peritonsillar abscess Referring Physician: Domenic Moras, PA-C  HPI:  Victor Reyes is an 22 y.o. male who presented today to the ER c/o severe left sided throat pain. His sore throat started 3 days ago. He has had progressive worsening of his sore throat.  Now he is having trouble tolerating his secretion.  He tried using Chloraseptic throat spray without adequate relief.  His CT in the ER shows a large left peritonsillar abscess.   Past Medical History:  Diagnosis Date  . Hypertension     History reviewed. No pertinent surgical history.  No family history on file.  Social History:  reports that  has never smoked. he has never used smokeless tobacco. He reports that he does not drink alcohol or use drugs.  Allergies: No Known Allergies  Prior to Admission medications   Medication Sig Start Date End Date Taking? Authorizing Provider  benzonatate (TESSALON) 100 MG capsule Take 1 capsule (100 mg total) by mouth every 8 (eight) hours. 09/03/15   Sam, Olivia Canter, PA-C  cyclobenzaprine (FLEXERIL) 10 MG tablet Take 1 tablet (10 mg total) by mouth at bedtime as needed for muscle spasms. Patient not taking: Reported on 11/01/2014 06/05/14   Gregor Hams, MD  naproxen (NAPROSYN) 500 MG tablet Take 1 tablet (500 mg total) by mouth 2 (two) times daily. Patient not taking: Reported on 11/01/2014 06/01/14   Ashley Murrain, NP    Results for orders placed or performed during the hospital encounter of 10/06/17 (from the past 48 hour(s))  CBC with Differential     Status: Abnormal   Collection Time: 10/06/17  9:11 AM  Result Value Ref Range   WBC 20.3 (H) 4.0 - 10.5 K/uL   RBC 5.50 4.22 - 5.81 MIL/uL   Hemoglobin 14.7 13.0 - 17.0 g/dL   HCT 44.5 39.0 - 52.0 %   MCV 80.9 78.0 - 100.0 fL   MCH 26.7 26.0 - 34.0 pg   MCHC 33.0 30.0 - 36.0 g/dL   RDW 13.7 11.5 - 15.5 %   Platelets 289 150 - 400 K/uL   Neutrophils Relative % 76 %   Lymphocytes Relative 16 %   Monocytes Relative  8 %   Eosinophils Relative 0 %   Basophils Relative 0 %   Neutro Abs 15.5 (H) 1.7 - 7.7 K/uL   Lymphs Abs 3.2 0.7 - 4.0 K/uL   Monocytes Absolute 1.6 (H) 0.1 - 1.0 K/uL   Eosinophils Absolute 0.0 0.0 - 0.7 K/uL   Basophils Absolute 0.0 0.0 - 0.1 K/uL   WBC Morphology ATYPICAL LYMPHOCYTES   Basic metabolic panel     Status: Abnormal   Collection Time: 10/06/17  9:11 AM  Result Value Ref Range   Sodium 134 (L) 135 - 145 mmol/L   Potassium 3.6 3.5 - 5.1 mmol/L   Chloride 95 (L) 101 - 111 mmol/L   CO2 24 22 - 32 mmol/L   Glucose, Bld 144 (H) 65 - 99 mg/dL   BUN 13 6 - 20 mg/dL   Creatinine, Ser 1.53 (H) 0.61 - 1.24 mg/dL   Calcium 9.5 8.9 - 10.3 mg/dL   GFR calc non Af Amer >60 >60 mL/min   GFR calc Af Amer >60 >60 mL/min    Comment: (NOTE) The eGFR has been calculated using the CKD EPI equation. This calculation has not been validated in all clinical situations. eGFR's persistently <60 mL/min signify possible Chronic Kidney Disease.    Anion gap 15 5 -  15  Rapid strep screen     Status: None   Collection Time: 10/06/17 10:18 AM  Result Value Ref Range   Streptococcus, Group A Screen (Direct) NEGATIVE NEGATIVE    Comment: (NOTE) A Rapid Antigen test may result negative if the antigen level in the sample is below the detection level of this test. The FDA has not cleared this test as a stand-alone test therefore the rapid antigen negative result has reflexed to a Group A Strep culture.     Ct Soft Tissue Neck W Contrast  Result Date: 10/06/2017 CLINICAL DATA:  Sore throat worse on left side. Tonsil adenoid disorder EXAM: CT NECK WITH CONTRAST TECHNIQUE: Multidetector CT imaging of the neck was performed using the standard protocol following the bolus administration of intravenous contrast. CONTRAST:  76m ISOVUE-300 IOPAMIDOL (ISOVUE-300) INJECTION 61% COMPARISON:  None. FINDINGS: Pharynx and larynx: Large low-density fluid collection in the left tonsil which is markedly  enlarged. Fluid collection measures 2.0 x 2.7 x 5.0 cm and is compatible with peritonsillar large abscess. There edema in the left lateral pharyngeal wall extending down to the piriform sinus. Airway intact below the enlarged tonsil. Mild enlargement of the right tonsil. Vocal cords normal. Epiglottis normal. Salivary glands: No inflammation, mass, or stone. Thyroid: Negative Lymph nodes: Enlarged lymph nodes on the left, measuring 23 mm left level 2 station. Additional small level 2 and level 3 lymph nodes on the left and posterior lymph nodes on the left. Right level 2 lymph node 17 mm. Vascular: Negative Limited intracranial: Negative Visualized orbits: Negative Mastoids and visualized paranasal sinuses: Mild mucosal edema left maxillary sinus. Remaining sinuses clear. Skeleton: Negative Upper chest: Negative Other: None IMPRESSION: Large left peritonsillar abscess 2.0 x 2.7 x 5.0 cm. Left lateral pharyngeal wall edema. Airway intact. Mild enlargement of the right tonsil. Reactive lymph nodes in the neck left greater than right Electronically Signed   By: CFranchot GalloM.D.   On: 10/06/2017 10:18   Blood pressure (!) 175/96, pulse 95, temperature (!) 102.3 F (39.1 C), temperature source Oral, resp. rate 20, SpO2 100 %. Physical Exam  Constitutional: He appears well-developed and well-nourished. No distress. Diaphoretic.  Head: Atraumatic.  Right Ear: Tympanic membrane normal.  Left Ear: Tympanic membrane normal.  Mouth/Throat: Mucous membranes are normal. Uvula swollen and deviated to the right. Severe swelling of the left peritonsillar area. Mild trismus.  Eyes: Conjunctivae are normal.  Nose: Normal mucosa. Neck: Neck supple.  Cardiovascular: Tachycardia without murmur rubs or gallops  Pulmonary/Chest: Effort normal. No respiratory distress. He has no wheezes.  Abdominal: Soft. There is no tenderness.  Neurological: He is alert.  Skin: No rash noted.  Psychiatric: He has a normal mood and  affect.  Nursing note and vitals reviewed.  Procedure: Incision and drainage of the left peritonsillar abscess.   Anesthesia: local anesthesia with 1% lidocaine with epinephrine.   Description: The patient is placed upright on the hospital bed.  After adequate local anesthesia is achieved, an 18-G spinal needle is used to make multiple passes over the left superior tonsillar pole.  Approximately 6 cc of purulent fluid was evacuated.  The abscess cavity was incised opened with the spinal needle tip.  The patient tolerated the procedure well.    Assessment/Plan: Left peritonsillar abscess.  I&D performed without difficulty.  May d/c/ home on oral clindamycin '300mg'$  QID for 10 days. He may follow up with me as needed.  Emiliya Chretien W Jamien Casanova 10/06/2017, 2:13 PM

## 2017-10-06 NOTE — ED Provider Notes (Signed)
[facility] EMERGENCY DEPARTMENT Provider Note   CSN: [id number] Arrival date & time: 10/06/17  0847     History   Chief Complaint Chief Complaint  Patient presents with  . Sore Throat    HPI Quadir Muns is a 22 y.o. male.  HPI   22 year old male presenting for evaluation of sore throat.  Patient report for the past 3 days he has had progressive worsening sore throat worse in the left side.  Now he is having trouble tolerating his spit.  He tried using Chloraseptic throat spray without adequate relief.  He endorsed chills.  Throat discomfort is moderate in severity.  No report of headache, ear pain, chest pain, trouble breathing or abdominal cramping.  No recent sick contact.  Past Medical History:  Diagnosis Date  . Hypertension     There are no active problems to display for this patient.   History reviewed. No pertinent surgical history.     Home Medications    Prior to Admission medications   Medication Sig Start Date End Date Taking? Authorizing Provider  benzonatate (TESSALON) 100 MG capsule Take 1 capsule (100 mg total) by mouth every 8 (eight) hours. 09/03/15   Sam, Ace Gins, PA-C  cyclobenzaprine (FLEXERIL) 10 MG tablet Take 1 tablet (10 mg total) by mouth at bedtime as needed for muscle spasms. Patient not taking: Reported on 11/01/2014 06/05/14   Rodolph Bong, MD  naproxen (NAPROSYN) 500 MG tablet Take 1 tablet (500 mg total) by mouth 2 (two) times daily. Patient not taking: Reported on 11/01/2014 06/01/14   Janne Napoleon, NP    Family History No family history on file.  Social History Social History   Tobacco Use  . Smoking status: Never Smoker  . Smokeless tobacco: Never Used  Substance Use Topics  . Alcohol use: No  . Drug use: No     Allergies   Patient has no known allergies.   Review of Systems Review of Systems  Constitutional: Positive for fever.  HENT: Positive for drooling, sore throat, trouble swallowing and  voice change.   Respiratory: Negative for shortness of breath.   Gastrointestinal: Negative for abdominal pain.  Musculoskeletal: Positive for neck pain.  Neurological: Negative for headaches.  All other systems reviewed and are negative.    Physical Exam Updated Vital Signs BP (!) 180/110 (BP Location: Right Arm)   Pulse (!) 128   Temp (!) 102.3 F (39.1 C) (Oral)   Resp 20   SpO2 99%   Physical Exam  Constitutional: He appears well-developed and well-nourished. No distress.  Diaphoretic.  HENT:  Head: Atraumatic.  Right Ear: Tympanic membrane normal.  Left Ear: Tympanic membrane normal.  Mouth/Throat: Mucous membranes are normal. Uvula swelling present. Posterior oropharyngeal edema, posterior oropharyngeal erythema and tonsillar abscesses present. No oropharyngeal exudate. Tonsils are 2+ on the right. Tonsils are 2+ on the left. No tonsillar exudate.  Throat: Uvula is deviated to the right.  Large swelling noted to the left peritonsillar pillar with associated erythema.  Mild trismus.  Eyes: Conjunctivae are normal.  Neck: Neck supple.  Cardiovascular:  Tachycardia without murmur rubs or gallops  Pulmonary/Chest: Effort normal. No respiratory distress. He has no wheezes.  Abdominal: Soft. There is no tenderness.  Lymphadenopathy:    He has cervical adenopathy.  Neurological: He is alert.  Skin: No rash noted.  Psychiatric: He has a normal mood and affect.  Nursing note and vitals reviewed.    ED Treatments / Results  Labs (all labs ordered are listed, but only abnormal results are displayed) Labs Reviewed  CBC WITH DIFFERENTIAL/PLATELET - Abnormal; Notable for the following components:      Result Value   WBC 20.3 (*)    Neutro Abs 15.5 (*)    Monocytes Absolute 1.6 (*)    All other components within normal limits  BASIC METABOLIC PANEL - Abnormal; Notable for the following components:   Sodium 134 (*)    Chloride 95 (*)    Glucose, Bld 144 (*)     Creatinine, Ser 1.53 (*)    All other components within normal limits  RAPID STREP SCREEN (NOT AT [facility])  CULTURE, GROUP A STREP [facility])    EKG  EKG Interpretation None       Radiology Ct Soft Tissue Neck W Contrast  Result Date: 10/06/2017 CLINICAL DATA:  Sore throat worse on left side. Tonsil adenoid disorder EXAM: CT NECK WITH CONTRAST TECHNIQUE: Multidetector CT imaging of the neck was performed using the standard protocol following the bolus administration of intravenous contrast. CONTRAST:  75mL ISOVUE-300 IOPAMIDOL (ISOVUE-300) INJECTION 61% COMPARISON:  None. FINDINGS: Pharynx and larynx: Large low-density fluid collection in the left tonsil which is markedly enlarged. Fluid collection measures 2.0 x 2.7 x 5.0 cm and is compatible with peritonsillar large abscess. There edema in the left lateral pharyngeal wall extending down to the piriform sinus. Airway intact below the enlarged tonsil. Mild enlargement of the right tonsil. Vocal cords normal. Epiglottis normal. Salivary glands: No inflammation, mass, or stone. Thyroid: Negative Lymph nodes: Enlarged lymph nodes on the left, measuring 23 mm left level 2 station. Additional small level 2 and level 3 lymph nodes on the left and posterior lymph nodes on the left. Right level 2 lymph node 17 mm. Vascular: Negative Limited intracranial: Negative Visualized orbits: Negative Mastoids and visualized paranasal sinuses: Mild mucosal edema left maxillary sinus. Remaining sinuses clear. Skeleton: Negative Upper chest: Negative Other: None IMPRESSION: Large left peritonsillar abscess 2.0 x 2.7 x 5.0 cm. Left lateral pharyngeal wall edema. Airway intact. Mild enlargement of the right tonsil. Reactive lymph nodes in the neck left greater than right Electronically Signed   By: Marlan Palauharles  Clark M.D.   On: 10/06/2017 10:18    Procedures Procedures (including critical care time)  Medications Ordered in ED Medications  acetaminophen (TYLENOL) tablet 650 mg  (650 mg Oral Given 10/06/17 0907)  dexamethasone (DECADRON) injection 10 mg (10 mg Intravenous Given 10/06/17 0921)  iopamidol (ISOVUE-300) 61 % injection (75 mLs  Contrast Given 10/06/17 0950)  morphine 4 MG/ML injection 4 mg (4 mg Intravenous Given 10/06/17 1043)  ondansetron (ZOFRAN) injection 4 mg (4 mg Intravenous Given 10/06/17 1043)  clindamycin (CLEOCIN) IVPB 900 mg (0 mg Intravenous Stopped 10/06/17 1145)  sodium chloride 0.9 % bolus 1,000 mL (0 mLs Intravenous Stopped 10/06/17 1329)  lidocaine-EPINEPHrine (XYLOCAINE W/EPI) 2 %-1:200000 (PF) injection 20 mL (20 mLs Intradermal Given 10/06/17 1050)  dexamethasone (DECADRON) injection 10 mg (10 mg Intravenous Given 10/06/17 1050)  morphine 4 MG/ML injection 4 mg (4 mg Intravenous Given 10/06/17 1356)     Initial Impression / Assessment and Plan / ED Course  I have reviewed the triage vital signs and the nursing notes.  Pertinent labs & imaging results that were available during my care of the patient were reviewed by me and considered in my medical decision making (see chart for details).     BP (!) 163/100 (BP Location: Right Arm)   Pulse 97   Temp  98.9 F (37.2 C) (Oral)   Resp 16   SpO2 96%    Final Clinical Impressions(s) / ED Diagnoses   Final diagnoses:  Peritonsillar abscess    ED Discharge Orders        Ordered    clindamycin (CLEOCIN) 300 MG capsule  4 times daily     10/06/17 1457    HYDROcodone-acetaminophen (HYCET) 7.5-325 mg/15 ml solution  Every 6 hours PRN     10/06/17 1457    ibuprofen (ADVIL,MOTRIN) 600 MG tablet  Every 6 hours PRN     10/06/17 1457     10:35 AM Patient presents with sore throat.  On exam he has an obvious evidence of a left peritonsillar abscess.  Labs show an elevated white count of 20.3, And a neck soft tissue CT show a large left peritonsillar abscess.  His airway is intact.  Appreciate consultation from on-call ENT specialist, Dr. Suszanne Conners who will evaluate patient in the ER and likely  perform incision and drainage of the abscess.  He also request for 20 mg of Decadron, and 900 mg of clindamycin.  2:49 PM ENT specialist Dr. Suszanne Conners has seen and evaluated pt.  He was able to performed I&D and able to remove a moderate amount of discharge.  Pt felt much better.  VS improves.  His BP is elevated, and will need to have it recheck by PCP, however I suspect pain has played a role in his HTN.  Pt will go home with Clindamycin 300mg  QID x 10 days and will f/u outpt with Dr. Suszanne Conners.  Pt understand to return if his condition worsen.  In order to decrease risk of narcotic abuse. Pt's record were checked using the  Controlled Substance database.     Fayrene Helper, PA-C 10/06/17 1501    Charlynne Pander, MD 10/06/17 [phone]

## 2017-10-06 NOTE — ED Provider Notes (Signed)
MSE was initiated and I personally evaluated the patient and placed orders (if any) at  9:12 AM on October 06, 2017.  Victor CumminsDaniel Reyes is a 22 y.o. male was healthy who presents with 3 days of worsening sore throat, reports pain is mainly on the left neck extends down into the neck.  Patient reports this morning he woke up and had difficulty swallowing he is able to swallow some of his secretions but is spitting most of them in the bag.  Exam with severe tonsillar and peritonsillar swelling on the left with erythema, no obvious exudates, there is tenderness and lymphadenopathy on the left side of the neck.  No evidence of respiratory distress, no stridor, respirations equal and unlabored patient able to speak in complete sentences.  Patient is febrile and tachycardic.  Exam concerning for peritonsillar abscess, will initiate labs and CT of the soft tissue of the neck with contrast.  Patient also given Decadron to help with swelling.  The patient appears stable so that the remainder of the MSE may be completed by another provider.   Dartha LodgeFord, Giles Currie N, PA-C 10/06/17 [id number]    Bethann BerkshireZammit, Joseph, MD 10/06/17 [phone]

## 2017-10-06 NOTE — ED Triage Notes (Signed)
Left sided throat pain. Severely swollen only on left. Tender to touch neck. Voice hoarse and spitting a lot d/t pain with swallowing.

## 2017-10-06 NOTE — ED Notes (Signed)
Pt in A hallway  

## 2017-10-08 LAB — CULTURE, GROUP A STREP (THRC)

## 2018-08-13 ENCOUNTER — Ambulatory Visit (HOSPITAL_COMMUNITY)
Admission: EM | Admit: 2018-08-13 | Discharge: 2018-08-13 | Disposition: A | Discharge status: Home / Self Care | Payer: Medicaid Other | Attending: Family Medicine | Admitting: Family Medicine

## 2018-08-13 ENCOUNTER — Encounter (HOSPITAL_COMMUNITY): Payer: Self-pay | Admitting: Emergency Medicine

## 2018-08-13 ENCOUNTER — Other Ambulatory Visit: Payer: Self-pay

## 2018-08-13 DIAGNOSIS — I10 Essential (primary) hypertension: Secondary | ICD-10-CM

## 2018-08-13 DIAGNOSIS — H1033 Unspecified acute conjunctivitis, bilateral: Secondary | ICD-10-CM

## 2018-08-13 MED ORDER — POLYETHYL GLYCOL-PROPYL GLYCOL 0.4-0.3 % OP GEL: 1.0000 "application " | Freq: Every evening | OPHTHALMIC | 0 refills | Status: AC | PRN

## 2018-08-13 MED ORDER — OFLOXACIN 0.3 % OP SOLN: 1.0000 [drp] | Freq: Four times a day (QID) | OPHTHALMIC | 0 refills | Status: AC

## 2018-08-13 MED ORDER — CETIRIZINE HCL 10 MG PO TABS: 10.0000 mg | ORAL_TABLET | Freq: Every day | ORAL | 0 refills | Status: AC

## 2018-08-13 MED ORDER — OLOPATADINE HCL 0.2 % OP SOLN: 1.0000 [drp] | Freq: Every day | OPHTHALMIC | 0 refills | Status: AC

## 2018-08-13 NOTE — ED Triage Notes (Signed)
Pt has had redness and drainage in both eyes x2 days.

## 2018-08-13 NOTE — Discharge Instructions (Signed)
Use ofloxacin eyedrops as directed on both eyes. Artificial tear gel at night. Wait 10-15 minutes between drops, always use artificial tear gel last, as it prevents drops from penetrating through. If still with itching after treatment, start pataday for possible allergies causing eyes symptoms. Monitor for any worsening of symptoms, changes in vision, sensitivity to light, eye swelling, painful eye movement, follow up with ophthalmology for further evaluation.

## 2018-08-13 NOTE — ED Provider Notes (Signed)
[facility]    CSN: [id number] Arrival date & time: 08/13/18  1436     History   Chief Complaint Chief Complaint  Patient presents with  . Conjunctivitis    bilateral    HPI Victor Reyes is a 22 y.o. male.   23 year old male comes in for 2 day history of eye redness and irritation. States symptoms first started in the right eye, thinks he passed to the left as he has been rubbing both eyes. He denies eye pain, blurry vision, photophobia. Denies contact lens, glasses use. Has had sneezing, rhinorrhea, nasal congestion he correlates with allergies. No cough, fever. Has tried visine eye drops, which made symptoms worse.      Past Medical History:  Diagnosis Date  . Hypertension     There are no active problems to display for this patient.   History reviewed. No pertinent surgical history.     Home Medications    Prior to Admission medications   Medication Sig Start Date End Date Taking? Authorizing Provider  cetirizine (ZYRTEC) 10 MG tablet Take 1 tablet (10 mg total) by mouth daily. 08/13/18   Cathie Hoops, Amy V, PA-C  ibuprofen (ADVIL,MOTRIN) 600 MG tablet Take 1 tablet (600 mg total) by mouth every 6 (six) hours as needed. 10/06/17   Fayrene Helper, PA-C  naproxen (NAPROSYN) 500 MG tablet Take 1 tablet (500 mg total) by mouth 2 (two) times daily. Patient not taking: Reported on 11/01/2014 06/01/14   Janne Napoleon, NP  ofloxacin (OCUFLOX) 0.3 % ophthalmic solution Place 1 drop into both eyes 4 (four) times daily for 5 days. 08/13/18 08/18/18  Cathie Hoops, Amy V, PA-C  Olopatadine HCl 0.2 % SOLN Apply 1 drop to eye daily. 08/13/18   Cathie Hoops, Amy V, PA-C  Polyethyl Glycol-Propyl Glycol (SYSTANE) 0.4-0.3 % GEL ophthalmic gel Place 1 application into both eyes at bedtime as needed. 08/13/18   Belinda Fisher, PA-C    Family History History reviewed. No pertinent family history.  Social History Social History   Tobacco Use  . Smoking status: Never Smoker  . Smokeless tobacco: Never Used    Substance Use Topics  . Alcohol use: No  . Drug use: No     Allergies   Patient has no known allergies.   Review of Systems Review of Systems  Reason unable to perform ROS: See HPI as above.     Physical Exam Triage Vital Signs ED Triage Vitals [08/13/18 1549]  Enc Vitals Group     BP (!) 154/87     Pulse Rate 95     Resp      Temp 99.7 F (37.6 C)     Temp Source Oral     SpO2 99 %     Weight      Height      Head Circumference      Peak Flow      Pain Score 0     Pain Loc      Pain Edu?      Excl. in GC?    No data found.  Updated Vital Signs BP (!) 154/87 (BP Location: Left Wrist)   Pulse 95   Temp 99.7 F (37.6 C) (Oral)   SpO2 99%   Visual Acuity Right Eye Distance:   Left Eye Distance:   Bilateral Distance:    Right Eye Near: R Near: 20/30 without corrective lens Left Eye Near:  L Near: 20/30 without corrective lens Bilateral Near:  20/30  without corrective lens  Physical Exam  Constitutional: He is oriented to person, place, and time. He appears well-developed and well-nourished. No distress.  HENT:  Head: Normocephalic and atraumatic.  Nose: Nose normal. No mucosal edema or rhinorrhea.  Eyes: Pupils are equal, round, and reactive to light. EOM and lids are normal. Lids are everted and swept, no foreign bodies found. Right conjunctiva is injected. Left conjunctiva is injected.  No ciliary injection.   Neck: Normal range of motion. Neck supple.  Neurological: He is alert and oriented to person, place, and time.  Skin: Skin is warm and dry.     UC Treatments / Results  Labs (all labs ordered are listed, but only abnormal results are displayed) Labs Reviewed - No data to display  EKG None  Radiology No results found.  Procedures Procedures (including critical care time)  Medications Ordered in UC Medications - No data to display  Initial Impression / Assessment and Plan / UC Course  I have reviewed the triage vital signs and  the nursing notes.  Pertinent labs & imaging results that were available during my care of the patient were reviewed by me and considered in my medical decision making (see chart for details).    Will treat for bacterial conjunctivitis with ofloxacin. Other symptomatic treatment discussed. Will also call in pataday, can fill if symptoms not improving after ofloxacin for possible allergic conjunctivitis. Return precautions given.  Final Clinical Impressions(s) / UC Diagnoses   Final diagnoses:  Acute conjunctivitis of both eyes, unspecified acute conjunctivitis type    ED Prescriptions    Medication Sig Dispense Auth. Provider   ofloxacin (OCUFLOX) 0.3 % ophthalmic solution Place 1 drop into both eyes 4 (four) times daily for 5 days. 1 mL Yu, Amy V, PA-C   Olopatadine HCl 0.2 % SOLN Apply 1 drop to eye daily. 2.5 mL Yu, Amy V, PA-C   Polyethyl Glycol-Propyl Glycol (SYSTANE) 0.4-0.3 % GEL ophthalmic gel Place 1 application into both eyes at bedtime as needed. 1 Bottle Yu, Amy V, PA-C   cetirizine (ZYRTEC) 10 MG tablet Take 1 tablet (10 mg total) by mouth daily. 30 tablet Threasa AlphaYu, Amy V, PA-C        Yu, Amy V, [place]PA-C 08/13/18 1726

## 2020-02-10 ENCOUNTER — Other Ambulatory Visit: Payer: Self-pay

## 2020-02-10 ENCOUNTER — Emergency Department (HOSPITAL_COMMUNITY): Payer: Medicaid Other

## 2020-02-10 ENCOUNTER — Emergency Department (HOSPITAL_COMMUNITY)
Admission: EM | Admit: 2020-02-10 | Discharge: 2020-02-10 | Disposition: A | Discharge status: Home / Self Care | Payer: Medicaid Other | Attending: Emergency Medicine | Admitting: Emergency Medicine

## 2020-02-10 ENCOUNTER — Encounter (HOSPITAL_COMMUNITY): Payer: Self-pay | Admitting: Obstetrics and Gynecology

## 2020-02-10 DIAGNOSIS — F17290 Nicotine dependence, other tobacco product, uncomplicated: Secondary | ICD-10-CM | POA: Insufficient documentation

## 2020-02-10 DIAGNOSIS — M25562 Pain in left knee: Secondary | ICD-10-CM | POA: Insufficient documentation

## 2020-02-10 DIAGNOSIS — I10 Essential (primary) hypertension: Secondary | ICD-10-CM

## 2020-02-10 MED ORDER — CYCLOBENZAPRINE HCL 10 MG PO TABS: 10.0000 mg | ORAL_TABLET | Freq: Two times a day (BID) | ORAL | 0 refills | Status: AC | PRN

## 2020-02-10 NOTE — ED Provider Notes (Addendum)
[facility] DEPT Provider Note   CSN: [id number] Arrival date & time: 02/10/20  1811     History Chief Complaint  Patient presents with  . Knee Pain    Victor Reyes is a 24 y.o. male.  The history is provided by the patient. No language interpreter was used.  Knee Pain Associated symptoms: no fever      24 year old male with known history of hypertension not on any blood pressure medication presenting complaining of left knee pain.  Patient report for the past week he has had recurrent pain to his left knee.  Pain is described as a tightness sharp sensation that happens sporadically lasting sometimes for seconds sometimes much longer.  Pain can be present while at rest and sometimes with walking.  Pain is moderate in severity and nonradiating.  No associated fever chills no hip pain no ankle pain no numbness weakness no recent injury and has not noticed any warmth or rash to his knee.  He has been using over-the-counter icy hot, anti-inflammatory medication, and recently had a knee sleeve.  It appears the knee sleeve does provide some relief.  He does not have an orthopedist.  Past Medical History:  Diagnosis Date  . Hypertension     There are no problems to display for this patient.   No past surgical history on file.     No family history on file.  Social History   Tobacco Use  . Smoking status: Current Every Day Smoker    Types: Cigars  . Smokeless tobacco: Never Used  . Tobacco comment: Black and Milds  Substance Use Topics  . Alcohol use: No  . Drug use: No    Home Medications Prior to Admission medications   Medication Sig Start Date End Date Taking? Authorizing Provider  cetirizine (ZYRTEC) 10 MG tablet Take 1 tablet (10 mg total) by mouth daily. 08/13/18   Tasia Catchings, Amy V, PA-C  ibuprofen (ADVIL,MOTRIN) 600 MG tablet Take 1 tablet (600 mg total) by mouth every 6 (six) hours as needed. 10/06/17   Domenic Moras, PA-C  naproxen  (NAPROSYN) 500 MG tablet Take 1 tablet (500 mg total) by mouth 2 (two) times daily. Patient not taking: Reported on 11/01/2014 06/01/14   Ashley Murrain, NP  Olopatadine HCl 0.2 % SOLN Apply 1 drop to eye daily. 08/13/18   Tasia Catchings, Amy V, PA-C  Polyethyl Glycol-Propyl Glycol (SYSTANE) 0.4-0.3 % GEL ophthalmic gel Place 1 application into both eyes at bedtime as needed. 08/13/18   Ok Edwards, PA-C    Allergies    Patient has no known allergies.  Review of Systems   Review of Systems  Constitutional: Negative for fever.  Musculoskeletal: Positive for arthralgias.  Skin: Negative for rash and wound.  Neurological: Negative for numbness.    Physical Exam Updated Vital Signs BP (!) 167/106 (BP Location: Left Arm)   Pulse 97   Temp 98.9 F (37.2 C) (Oral)   Resp 17   SpO2 98%   Physical Exam Vitals and nursing note reviewed.  Constitutional:      General: He is not in acute distress.    Appearance: He is well-developed.  HENT:     Head: Atraumatic.  Eyes:     Conjunctiva/sclera: Conjunctivae normal.  Musculoskeletal:     Cervical back: Neck supple.     Comments: Left knee: Patella is located.  Knee with normal appearance.  No joint laxity.  No significant focal point tenderness.  No tenderness to  posterior fossa.  Normal flexion extension.  Normal gait.  Left hip and left ankle nontender.  Skin:    Capillary Refill: Capillary refill takes less than 2 seconds.     Findings: No rash.  Neurological:     Mental Status: He is alert.     ED Results / Procedures / Treatments   Labs (all labs ordered are listed, but only abnormal results are displayed) Labs Reviewed - No data to display  EKG None  Radiology DG Knee Complete 4 Views Left  Result Date: 02/10/2020 CLINICAL DATA:  Anterior left knee pain for 1 month. No known injury. EXAM: LEFT KNEE - COMPLETE 4+ VIEW COMPARISON:  None. FINDINGS: No evidence of fracture, dislocation, or joint effusion. No evidence of arthropathy or  other focal bone abnormality. Soft tissues are unremarkable. IMPRESSION: Negative. Electronically Signed   By: Danae Orleans M.D.   On: 02/10/2020 18:58    Procedures Procedures (including critical care time)  Medications Ordered in ED Medications - No data to display  ED Course  I have reviewed the triage vital signs and the nursing notes.  Pertinent labs & imaging results that were available during my care of the patient were reviewed by me and considered in my medical decision making (see chart for details).    MDM Rules/Calculators/A&P                      BP (!) 192/125 (BP Location: Left Arm)   Pulse 88   Temp 98.9 F (37.2 C) (Oral)   Resp 16   SpO2 99%   Final Clinical Impression(s) / ED Diagnoses Final diagnoses:  Acute pain of left knee  Hypertension, unspecified type    Rx / DC Orders ED Discharge Orders         Ordered    cyclobenzaprine (FLEXERIL) 10 MG tablet  2 times daily PRN     02/10/20 1937         7:36 PM Patient report intermittent pain to his left knee.  He is able to ambulate without difficulty.  States that this time pain is controlled but it would happen sporadically.  And x-ray of left knee show no acute finding.  Low suspicion for septic joint, fracture or dislocation, or cellulitis.  Encourage patient to continue with rice therapy and wear knee sleeve as it has helped.  Will prescribe Flexeril and will also refer to orthopedic for further care.  Patient was found to be hypertensive with a blood pressure of 192/125.  This is asymptomatic hypertension.  Known history of hypertension.  Encourage patient to have his blood pressure rechecked by PCP and manage further otherwise long-term untreated hypertension can cause significant health risks.      Fayrene Helper, PA-C 02/10/20 1942    Linwood Dibbles, MD 02/11/20 [phone]

## 2020-02-10 NOTE — ED Triage Notes (Signed)
Pt reports left knee pain. Patient reports he has been using topical muscle creams and knee sleeve/brace.

## 2020-02-10 NOTE — ED Notes (Signed)
Rn discussed implications of high BP with patient and some of the complications of leaving it untreated. Patient reports understanding and agrees to followup

## 2020-02-10 NOTE — Discharge Instructions (Signed)
Please continue to wear knee sleeves and use cream for your knee pain.  You may take muscle relaxant at nighttime as it may help with your pain and discomfort.  Call and follow-up with an orthopedist for further management of your condition.  Your blood pressure is high today, it is important to follow-up with your primary care provider for management of your high blood pressure.  Untreated high blood pressure for prolonged period of time can greatly affect your health.

## 2020-02-11 NOTE — Progress Notes (Addendum)
02/11/2020 616 pm TOC CM spoke to pt and agreeable to CM arranging a follow up appt to see PCP. States he was diagnosed with Hypertension as a teenager, but stop taking meds without consulting a PCP. States he has tried to manage with diet and exercise. Isidoro Donning RN CCM, [facility] ED TOC [place] [phone]  02/12/2020 1130 Appt arranged for 6/10 at 1010 am. Contacted pt with information on appt time. Isidoro Donning RN CCM, [facility] ED TOC CM [phone]

## 2020-02-20 ENCOUNTER — Ambulatory Visit: Payer: Medicaid Other

## 2020-07-23 ENCOUNTER — Emergency Department (HOSPITAL_COMMUNITY)
Admission: EM | Admit: 2020-07-23 | Discharge: 2020-07-24 | Disposition: A | Discharge status: Home / Self Care | Payer: Self-pay | Attending: Emergency Medicine | Admitting: Emergency Medicine

## 2020-07-23 DIAGNOSIS — S3991XA Unspecified injury of abdomen, initial encounter: Secondary | ICD-10-CM | POA: Insufficient documentation

## 2020-07-23 DIAGNOSIS — T1490XA Injury, unspecified, initial encounter: Secondary | ICD-10-CM

## 2020-07-23 DIAGNOSIS — W3400XA Accidental discharge from unspecified firearms or gun, initial encounter: Secondary | ICD-10-CM | POA: Insufficient documentation

## 2020-07-23 DIAGNOSIS — S31829A Unspecified open wound of left buttock, initial encounter: Secondary | ICD-10-CM | POA: Insufficient documentation

## 2020-07-23 DIAGNOSIS — Z23 Encounter for immunization: Secondary | ICD-10-CM | POA: Insufficient documentation
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
## 2020-07-24 ENCOUNTER — Other Ambulatory Visit: Payer: Self-pay

## 2020-07-24 ENCOUNTER — Emergency Department (HOSPITAL_COMMUNITY): Payer: Self-pay

## 2020-07-24 ENCOUNTER — Encounter (HOSPITAL_COMMUNITY): Payer: Self-pay

## 2020-07-24 LAB — I-STAT CHEM 8, ED
BUN: 11 mg/dL (ref 6–20)
Calcium, Ion: 1.11 mmol/L — ABNORMAL LOW (ref 1.15–1.40)
Chloride: 103 mmol/L (ref 98–111)
Creatinine, Ser: 1.4 mg/dL — ABNORMAL HIGH (ref 0.61–1.24)
Glucose, Bld: 102 mg/dL — ABNORMAL HIGH (ref 70–99)
HCT: 48 % (ref 39.0–52.0)
Hemoglobin: 16.3 g/dL (ref 13.0–17.0)
Potassium: 3.1 mmol/L — ABNORMAL LOW (ref 3.5–5.1)
Sodium: 142 mmol/L (ref 135–145)
TCO2: 23 mmol/L (ref 22–32)

## 2020-07-24 LAB — COMPREHENSIVE METABOLIC PANEL
ALT: 13 U/L (ref 0–44)
AST: 18 U/L (ref 15–41)
Albumin: 4.2 g/dL (ref 3.5–5.0)
Alkaline Phosphatase: 52 U/L (ref 38–126)
Anion gap: 11 (ref 5–15)
BUN: 10 mg/dL (ref 6–20)
CO2: 24 mmol/L (ref 22–32)
Calcium: 9.4 mg/dL (ref 8.9–10.3)
Chloride: 104 mmol/L (ref 98–111)
Creatinine, Ser: 1.29 mg/dL — ABNORMAL HIGH (ref 0.61–1.24)
GFR, Estimated: >60 mL/min (ref >60)
Glucose, Bld: 102 mg/dL — ABNORMAL HIGH (ref 70–99)
Potassium: 3.1 mmol/L — ABNORMAL LOW (ref 3.5–5.1)
Sodium: 139 mmol/L (ref 135–145)
Total Bilirubin: 0.7 mg/dL (ref 0.3–1.2)
Total Protein: 8 g/dL (ref 6.5–8.1)

## 2020-07-24 LAB — CBC
HCT: 46.6 % (ref 39.0–52.0)
Hemoglobin: 14.6 g/dL (ref 13.0–17.0)
MCH: 26.2 pg (ref 26.0–34.0)
MCHC: 31.3 g/dL (ref 30.0–36.0)
MCV: 83.7 fL (ref 80.0–100.0)
Platelets: 242 10*3/uL (ref 150–400)
RBC: 5.57 MIL/uL (ref 4.22–5.81)
RDW: 13.2 % (ref 11.5–15.5)
WBC: 9.2 10*3/uL (ref 4.0–10.5)
nRBC: 0 % (ref 0.0–0.2)

## 2020-07-24 LAB — SAMPLE TO BLOOD BANK

## 2020-07-24 LAB — LACTIC ACID, PLASMA: Lactic Acid, Venous: 1.9 mmol/L (ref 0.5–1.9)

## 2020-07-24 LAB — PROTIME-INR
INR: 1 (ref 0.8–1.2)
Prothrombin Time: 12.6 seconds (ref 11.4–15.2)

## 2020-07-24 LAB — ETHANOL: Alcohol, Ethyl (B): 70 mg/dL — ABNORMAL HIGH (ref <10)

## 2020-07-24 MED ORDER — CEFAZOLIN SODIUM-DEXTROSE 2-4 GM/100ML-% IV SOLN
2.0000 g | Freq: Once | INTRAVENOUS | Status: AC
  Administered 2020-07-24: 2 g via INTRAVENOUS
  Filled 2020-07-24: qty 100

## 2020-07-24 MED ORDER — IOHEXOL 300 MG/ML  SOLN
100.0000 mL | Freq: Once | INTRAMUSCULAR | Status: AC | PRN
  Administered 2020-07-24: 100 mL via INTRAVENOUS

## 2020-07-24 MED ORDER — TETANUS-DIPHTH-ACELL PERTUSSIS 5-2.5-18.5 LF-MCG/0.5 IM SUSY
0.5000 mL | PREFILLED_SYRINGE | Freq: Once | INTRAMUSCULAR | Status: AC
  Administered 2020-07-24: 0.5 mL via INTRAMUSCULAR
  Filled 2020-07-24: qty 0.5
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
## 2020-07-24 NOTE — ED Notes (Signed)
Family updated as to patient's status by pt; pt able to utilize personal cellphone

## 2020-07-24 NOTE — ED Notes (Signed)
Patient transported to CT by this RN 
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
## 2020-07-24 NOTE — Discharge Instructions (Addendum)
You may alternate Tylenol 1000 mg every 6 hours as needed for pain and Ibuprofen 800 mg every 8 hours as needed for pain,.  Please take Ibuprofen with food.  Do not take more than 4000 mg of Tylenol (acetaminophen) in a 24 hour period.  Steps to find a Primary Care Provider (PCP):  Call [phone] or [phone] to access "[facility] Find a Doctor Service."  2.  You may also go on the [facility] website at [email or web address]  3.  [facility] also frequently accepts new patients.  [facility]  [address] [place] [address] [phone]  4.  There are also multiple [facility], [facility] and [facility] practices throughout the [place] that are frequently accepting new patients. You may find a clinic that is close to your home and contact them.  [email or web address] [phone]  [facility] [email or web address]  [email or web address] [phone]  [facility] [email or web address] [phone]  5.  Local Health Departments also can provide primary care services.  [facility]  [address] [place], [place] [address] [phone]  [facility] [address] [place] [address] [phone]  [facility] [address] [place] [address]  [place] [address] [phone]
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
## 2020-07-24 NOTE — ED Notes (Signed)
Patient verbalizes understanding of discharge instructions. Opportunity for questioning and answers were provided. Armband removed by staff, pt discharged from ED stable & ambulatory  

## 2020-07-27 ENCOUNTER — Encounter (HOSPITAL_COMMUNITY): Payer: Self-pay | Admitting: Obstetrics and Gynecology

## 2020-09-10 ENCOUNTER — Encounter (HOSPITAL_COMMUNITY): Payer: Self-pay | Admitting: Emergency Medicine

## 2020-09-10 ENCOUNTER — Emergency Department (HOSPITAL_COMMUNITY)
Admission: EM | Admit: 2020-09-10 | Discharge: 2020-09-10 | Disposition: A | Discharge status: Home / Self Care | Attending: Emergency Medicine | Admitting: Emergency Medicine

## 2020-09-10 ENCOUNTER — Other Ambulatory Visit: Payer: Self-pay

## 2020-09-10 DIAGNOSIS — K529 Noninfective gastroenteritis and colitis, unspecified: Secondary | ICD-10-CM

## 2020-09-10 DIAGNOSIS — R197 Diarrhea, unspecified: Secondary | ICD-10-CM | POA: Diagnosis not present

## 2020-09-10 DIAGNOSIS — R112 Nausea with vomiting, unspecified: Secondary | ICD-10-CM | POA: Insufficient documentation

## 2020-09-10 DIAGNOSIS — F17290 Nicotine dependence, other tobacco product, uncomplicated: Secondary | ICD-10-CM | POA: Diagnosis not present

## 2020-09-10 DIAGNOSIS — I10 Essential (primary) hypertension: Secondary | ICD-10-CM | POA: Insufficient documentation

## 2020-09-10 LAB — COMPREHENSIVE METABOLIC PANEL
ALT: 18 U/L (ref 0–44)
AST: 20 U/L (ref 15–41)
Albumin: 5.1 g/dL — ABNORMAL HIGH (ref 3.5–5.0)
Alkaline Phosphatase: 55 U/L (ref 38–126)
Anion gap: 19 — ABNORMAL HIGH (ref 5–15)
BUN: 24 mg/dL — ABNORMAL HIGH (ref 6–20)
CO2: 21 mmol/L — ABNORMAL LOW (ref 22–32)
Calcium: 10.1 mg/dL (ref 8.9–10.3)
Chloride: 94 mmol/L — ABNORMAL LOW (ref 98–111)
Creatinine, Ser: 1.38 mg/dL — ABNORMAL HIGH (ref 0.61–1.24)
GFR, Estimated: >60 mL/min (ref >60)
Glucose, Bld: 103 mg/dL — ABNORMAL HIGH (ref 70–99)
Potassium: 3 mmol/L — ABNORMAL LOW (ref 3.5–5.1)
Sodium: 134 mmol/L — ABNORMAL LOW (ref 135–145)
Total Bilirubin: 1.4 mg/dL — ABNORMAL HIGH (ref 0.3–1.2)
Total Protein: 9.8 g/dL — ABNORMAL HIGH (ref 6.5–8.1)

## 2020-09-10 LAB — CBC WITH DIFFERENTIAL/PLATELET
Abs Immature Granulocytes: 0.02 10*3/uL (ref 0.00–0.07)
Basophils Absolute: 0 10*3/uL (ref 0.0–0.1)
Basophils Relative: 0 %
Eosinophils Absolute: 0 10*3/uL (ref 0.0–0.5)
Eosinophils Relative: 0 %
HCT: 52.6 % — ABNORMAL HIGH (ref 39.0–52.0)
Hemoglobin: 17.4 g/dL — ABNORMAL HIGH (ref 13.0–17.0)
Immature Granulocytes: 0 %
Lymphocytes Relative: 40 %
Lymphs Abs: 3.1 10*3/uL (ref 0.7–4.0)
MCH: 26.2 pg (ref 26.0–34.0)
MCHC: 33.1 g/dL (ref 30.0–36.0)
MCV: 79.2 fL — ABNORMAL LOW (ref 80.0–100.0)
Monocytes Absolute: 0.7 10*3/uL (ref 0.1–1.0)
Monocytes Relative: 9 %
Neutro Abs: 4 10*3/uL (ref 1.7–7.7)
Neutrophils Relative %: 51 %
Platelets: 302 10*3/uL (ref 150–400)
RBC: 6.64 MIL/uL — ABNORMAL HIGH (ref 4.22–5.81)
RDW: 13 % (ref 11.5–15.5)
WBC: 7.8 10*3/uL (ref 4.0–10.5)
nRBC: 0 % (ref 0.0–0.2)

## 2020-09-10 LAB — LIPASE, BLOOD: Lipase: 28 U/L (ref 11–51)

## 2020-09-10 MED ORDER — ONDANSETRON HCL 8 MG PO TABS
8.0000 mg | ORAL_TABLET | ORAL | 0 refills | Status: AC | PRN
Start: 2020-09-10 — End: ?

## 2020-09-10 MED ORDER — ONDANSETRON HCL 4 MG/2ML IJ SOLN
4.0000 mg | Freq: Once | INTRAMUSCULAR | Status: AC
  Administered 2020-09-10: 4 mg via INTRAVENOUS
  Filled 2020-09-10: qty 2

## 2020-09-10 MED ORDER — KETOROLAC TROMETHAMINE 30 MG/ML IJ SOLN
30.0000 mg | Freq: Once | INTRAMUSCULAR | Status: AC
  Administered 2020-09-10: 30 mg via INTRAVENOUS
  Filled 2020-09-10: qty 1

## 2020-09-10 MED ORDER — SODIUM CHLORIDE 0.9 % IV BOLUS
1000.0000 mL | Freq: Once | INTRAVENOUS | Status: AC
  Administered 2020-09-10: 1000 mL via INTRAVENOUS
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
## 2020-09-10 NOTE — Discharge Instructions (Addendum)
Take clear liquids as tolerated for the next 12 hours, then slowly advance to toast and crackers as tolerated.  Take Zofran as prescribed as needed for nausea.  Return to the emergency department if you develop severe abdominal pain, high fever, or other new and concerning symptoms.
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
## 2020-11-04 ENCOUNTER — Ambulatory Visit
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
